# Patient Record
Sex: Male | Born: 1970 | Race: White | Hispanic: No | Marital: Married | Smoking: Never smoker
Health system: Southern US, Community
[De-identification: ages and names within clinical notes are randomized; demographics above are authoritative.]

---

## 2014-09-02 ENCOUNTER — Other Ambulatory Visit: Payer: Self-pay | Admitting: Adult Health

## 2014-09-02 ENCOUNTER — Ambulatory Visit (INDEPENDENT_AMBULATORY_CARE_PROVIDER_SITE_OTHER): Payer: Self-pay

## 2014-09-02 DIAGNOSIS — T1490XA Injury, unspecified, initial encounter: Secondary | ICD-10-CM

## 2014-09-02 DIAGNOSIS — M25572 Pain in left ankle and joints of left foot: Secondary | ICD-10-CM

## 2014-09-20 ENCOUNTER — Ambulatory Visit (INDEPENDENT_AMBULATORY_CARE_PROVIDER_SITE_OTHER): Payer: Self-pay

## 2014-09-20 ENCOUNTER — Other Ambulatory Visit: Payer: Self-pay | Admitting: Emergency Medicine

## 2014-09-20 DIAGNOSIS — S52122A Displaced fracture of head of left radius, initial encounter for closed fracture: Secondary | ICD-10-CM

## 2014-09-20 DIAGNOSIS — M25532 Pain in left wrist: Secondary | ICD-10-CM

## 2014-09-20 DIAGNOSIS — T1490XA Injury, unspecified, initial encounter: Secondary | ICD-10-CM

## 2016-04-02 IMAGING — CR DG ELBOW COMPLETE 3+V*L*
4 series · 4 of 4 positions shown · non-contrast
Comparison: No prior.

CLINICAL DATA: Left lateral elbow pain.  Fall at work this a.m..

EXAM:
LEFT ELBOW - COMPLETE 3+ VIEW

[view not recorded (1 of 4)]
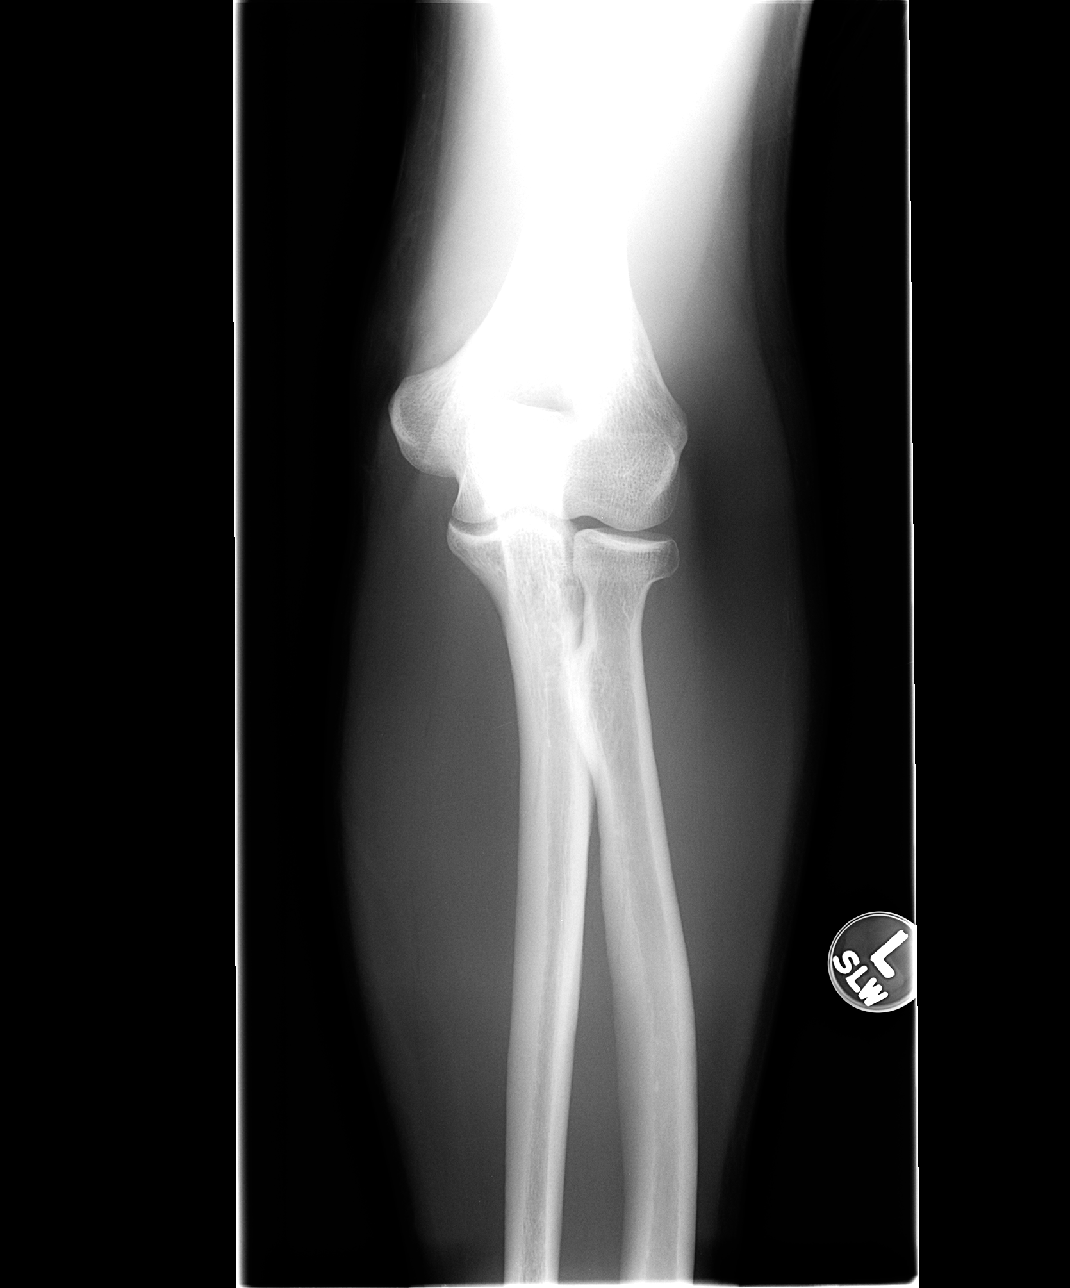

[view not recorded (2 of 4)]
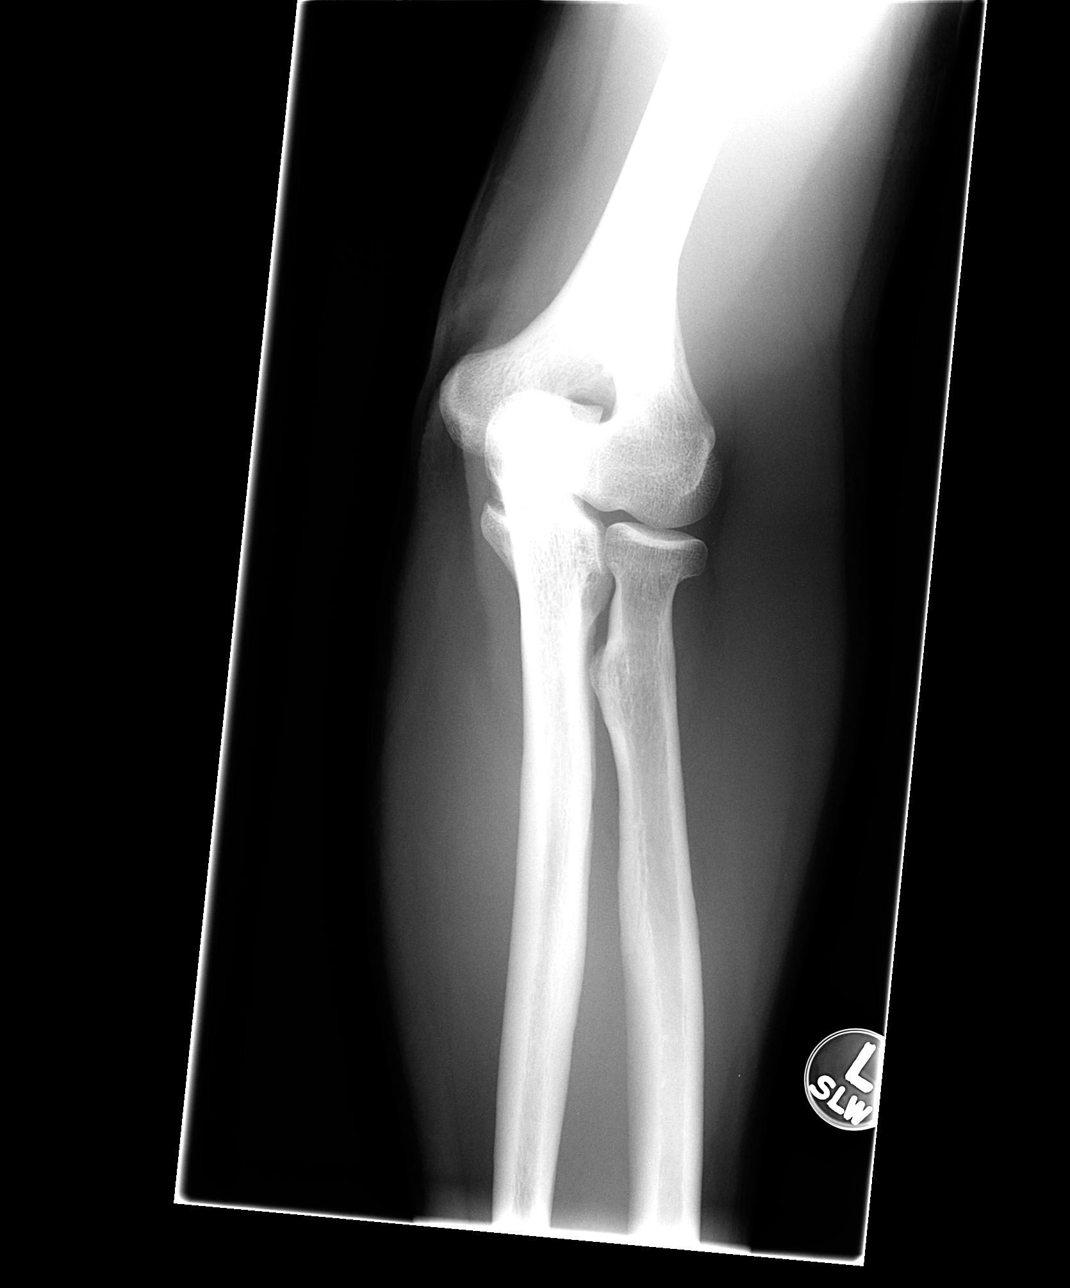

[view not recorded (3 of 4)]
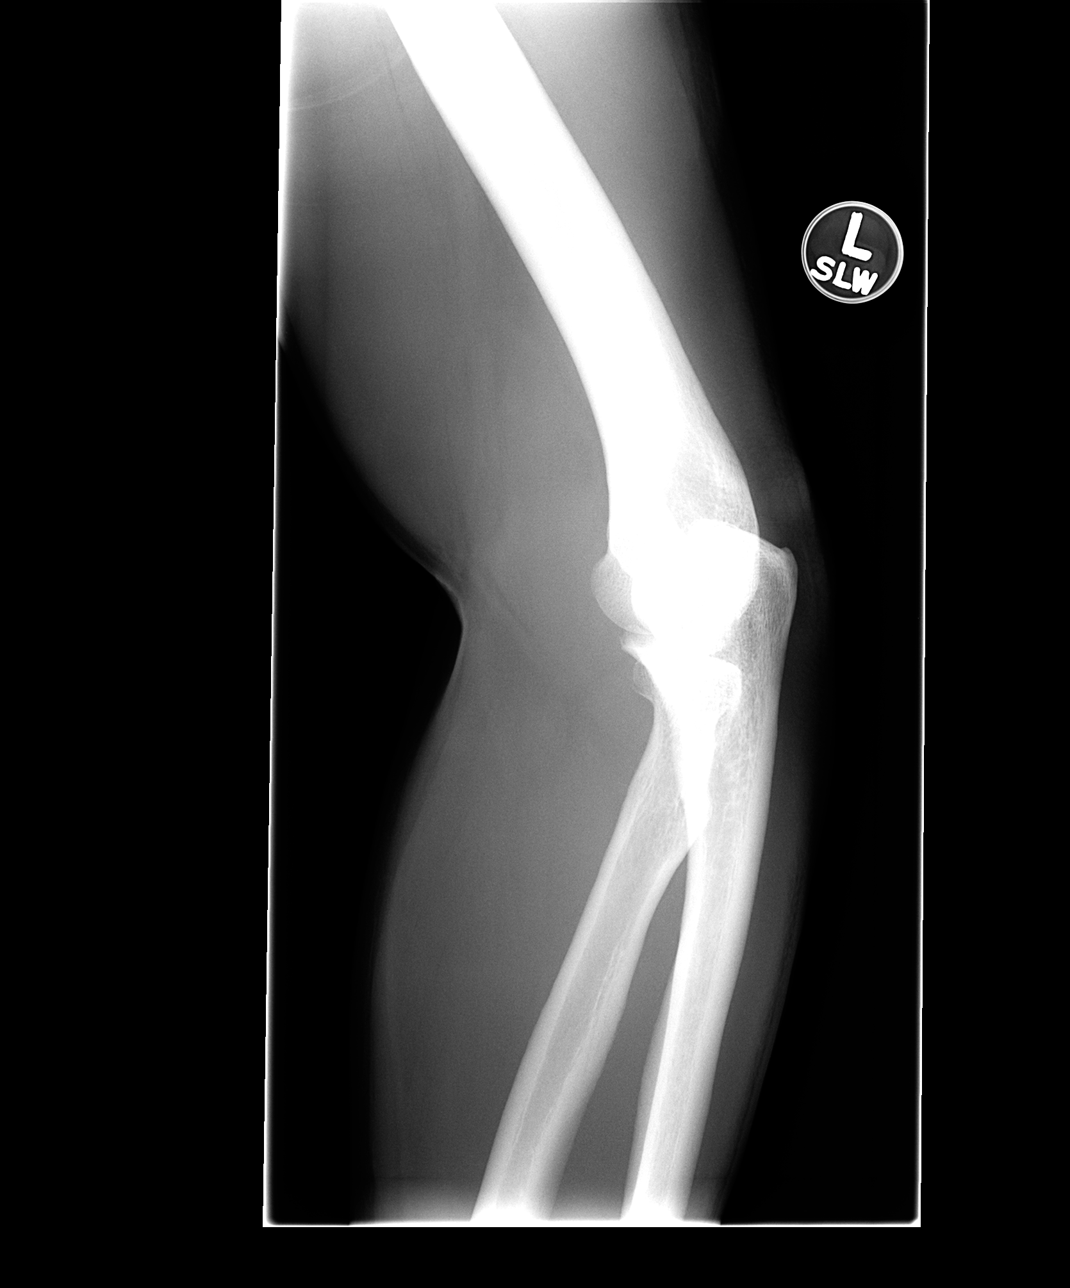

[view not recorded (4 of 4)]
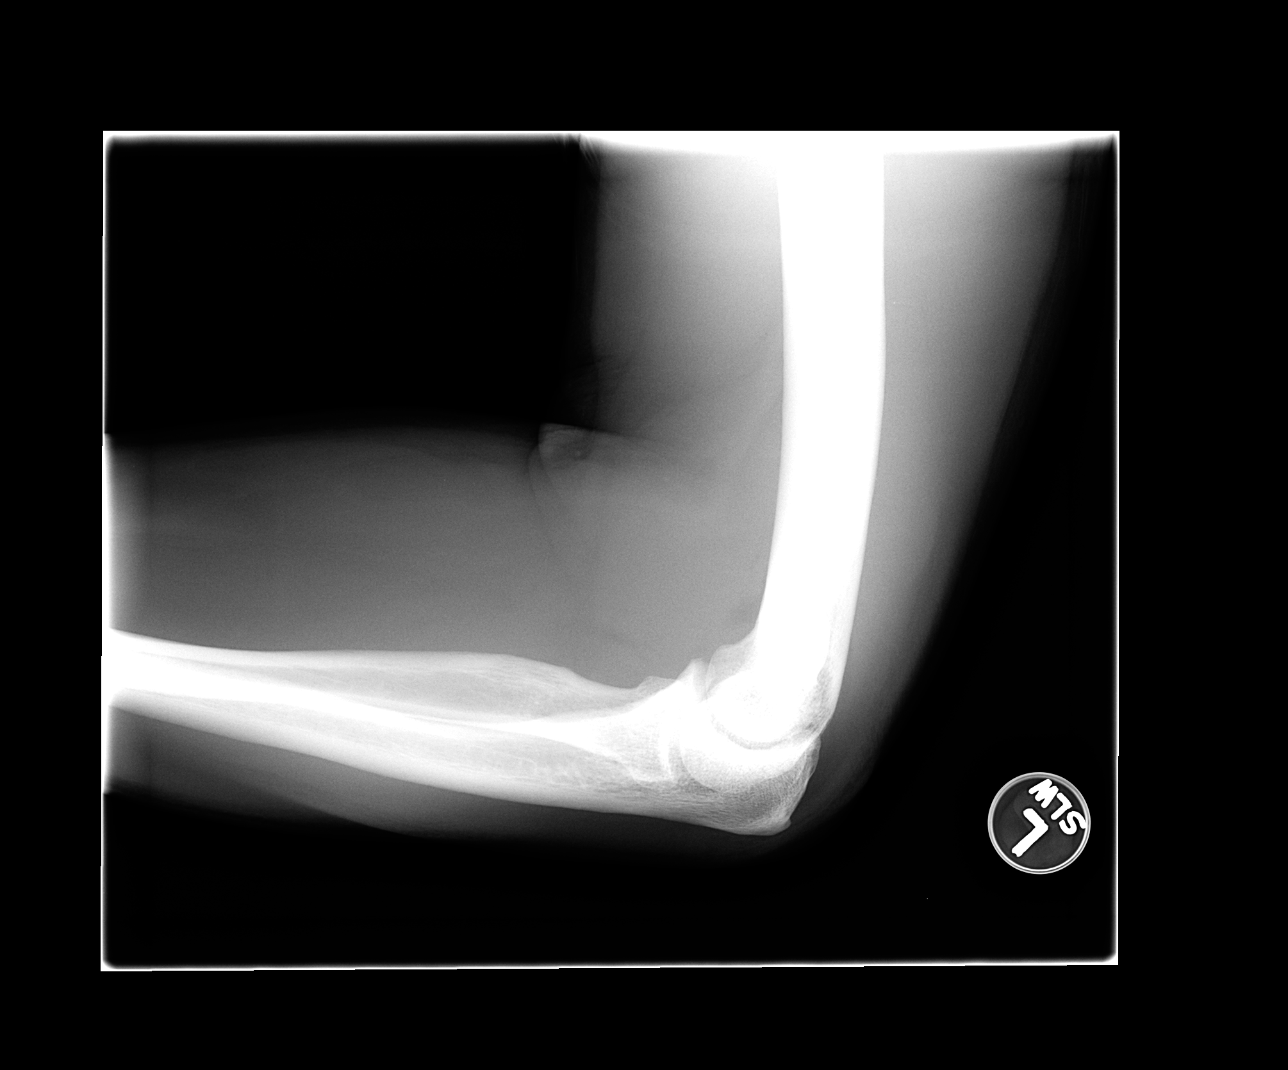

[4 of 4 positions shown; findings below may reference images not displayed]

FINDINGS: Left elbow joint effusion is noted. Several fractured radial head
appears present. Fracture is nondisplaced. No evidence of
dislocation.
IMPRESSION: Subtle left radial head fracture with left elbow joint effusion
small effusion.

## 2021-11-08 ENCOUNTER — Other Ambulatory Visit: Payer: Self-pay

## 2021-11-08 ENCOUNTER — Emergency Department (INDEPENDENT_AMBULATORY_CARE_PROVIDER_SITE_OTHER)
Admission: EM | Admit: 2021-11-08 | Discharge: 2021-11-08 | Disposition: A | Discharge status: Home / Self Care | Payer: Commercial Managed Care - PPO | Source: Home / Self Care

## 2021-11-08 DIAGNOSIS — W57XXXA Bitten or stung by nonvenomous insect and other nonvenomous arthropods, initial encounter: Secondary | ICD-10-CM

## 2021-11-08 DIAGNOSIS — S80261A Insect bite (nonvenomous), right knee, initial encounter: Secondary | ICD-10-CM

## 2021-11-08 DIAGNOSIS — R21 Rash and other nonspecific skin eruption: Secondary | ICD-10-CM

## 2021-11-08 NOTE — ED Provider Notes (Signed)
?[facility] ? ? ? ?CSN: [id number] ?Arrival date & time: 11/08/21  1644 ? ? ?  ? ?History   ?Chief Complaint ?Chief Complaint  ?Patient presents with  ? Insect Bite  ?  Tick bite, RT leg  ? ? ?HPI ?Raul Torrance is a 51 y.o. male.  ? ?HPI ? ?Patient has a tick bite on the back of his right leg.  His wife pulled the tick off for him on Sunday.  It is now Wednesday.  He is here because the bite area is firm, red, and terribly itchy.  No fever or chills.  No headache or body ache.  No skin rash. ? ?History reviewed. No pertinent past medical history. ? ?There are no problems to display for this patient. ? ? ?History reviewed. No pertinent surgical history. ? ? ? ? ?Home Medications   ? ?Prior to Admission medications   ?Medication Sig Start Date End Date Taking? Authorizing Provider  ?levothyroxine (SYNTHROID) 112 MCG tablet Take 112 mcg by mouth daily. 09/27/21   [provider]  ? ? ?Family History ?History reviewed. No pertinent family history. ? ?Social History ?Social History  ? ?Tobacco Use  ? Smoking status: Never  ? Smokeless tobacco: Never  ?Vaping Use  ? Vaping Use: Never used  ?Substance Use Topics  ? Alcohol use: Yes  ?  Comment: occ  ? ? ? ?Allergies   ?Patient has no allergy information on record. ? ? ?Review of Systems ?Review of Systems ?See HPI ? ?Physical Exam ?Triage Vital Signs ?ED Triage Vitals  ?Enc Vitals Group  ?   BP 11/08/21 1705 (!) 162/78  ?   Pulse Rate 11/08/21 1705 64  ?   Resp 11/08/21 1705 18  ?   Temp 11/08/21 1705 98.6 ?F (37 ?C)  ?   Temp Source 11/08/21 1705 Oral  ?   SpO2 11/08/21 1705 99 %  ?   Weight --   ?   Height --   ?   Head Circumference --   ?   Peak Flow --   ?   Pain Score 11/08/21 1706 0  ?   Pain Loc --   ?   Pain Edu? --   ?   Excl. in GC? --   ? ?No data found. ? ?Updated Vital Signs ?BP (!) 162/78 (BP Location: Right Arm)   Pulse 64   Temp 98.6 ?F (37 ?C) (Oral)   Resp 18   SpO2 99%  ?   ? ?Physical Exam ?Constitutional:   ?   General: He is  not in acute distress. ?   Appearance: Normal appearance. He is well-developed and normal weight.  ?HENT:  ?   Head: Normocephalic and atraumatic.  ?   Mouth/Throat:  ?   Comments: Mask is in place ?Eyes:  ?   Conjunctiva/sclera: Conjunctivae normal.  ?   Pupils: Pupils are equal, round, and reactive to light.  ?Cardiovascular:  ?   Rate and Rhythm: Normal rate.  ?Pulmonary:  ?   Effort: Pulmonary effort is normal. No respiratory distress.  ?Abdominal:  ?   General: There is no distension.  ?   Palpations: Abdomen is soft.  ?Musculoskeletal:     ?   General: Normal range of motion.  ?   Cervical back: Normal range of motion.  ?Skin: ?   General: Skin is warm and dry.  ?   Findings: Lesion present.  ?   Comments: On the back of  the right leg there is an excoriated lesion that measures 4 mm.  There is another 2 mm surrounding erythema.  No target lesion.  ?Neurological:  ?   General: No focal deficit present.  ?   Mental Status: He is alert.  ?   Gait: Gait normal.  ? ? ? ?UC Treatments / Results  ?Labs ?(all labs ordered are listed, but only abnormal results are displayed) ?Labs Reviewed - No data to display ? ?EKG ? ? ?Radiology ?No results found. ? ?Procedures ?Procedures (including critical care time) ? ?Medications Ordered in UC ?Medications - No data to display ? ?Initial Impression / Assessment and Plan / UC Course  ?I have reviewed the triage vital signs and the nursing notes. ? ?Pertinent labs & imaging results that were available during my care of the patient were reviewed by me and considered in my medical decision making (see chart for details). ? ?  ? ?Patient showed me a picture of the tick on his phone.  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  These are not associated with Lyme disease.  He is to watch for any systemic symptoms and let us know or call his personal physician. ?Final Clinical Impressions(s) / UC Diagnoses  ? ?Final diagnoses:  ?Rash  ?Tick bite of right knee, initial encounter  ? ? ? ?Discharge  Instructions   ? ?  ?May take antihistamines for the itching.  Claritin or Zyrtec during the day, Benadryl at night ?May use a cortisone cream to reduce the itching and swelling ?This will slowly resolve over a period of weeks ?Return if you have enlarging rash, increasing pain, fever or chills or new symptoms ? ? ?ED Prescriptions   ?None ?  ? ?PDMP not reviewed this encounter. ?  ?Eustace Moore, MD ?11/08/21 1744 ? ?

## 2021-11-08 NOTE — Discharge Instructions (Signed)
May take antihistamines for the itching.  Claritin or Zyrtec during the day, Benadryl at night ?May use a cortisone cream to reduce the itching and swelling ?This will slowly resolve over a period of weeks ?Return if you have enlarging rash, increasing pain, fever or chills or new symptoms ?

## 2021-11-08 NOTE — ED Triage Notes (Addendum)
Pt c/o tick bite since Sunday evening. Wife removed. Pt states area is red and spreading. Itching every day since.  ?

## 2023-05-25 ENCOUNTER — Other Ambulatory Visit: Payer: Self-pay

## 2023-05-25 ENCOUNTER — Ambulatory Visit
Admission: EM | Admit: 2023-05-25 | Discharge: 2023-05-25 | Disposition: A | Discharge status: Home / Self Care | Payer: Commercial Managed Care - PPO | Attending: Family Medicine | Admitting: Family Medicine

## 2023-05-25 DIAGNOSIS — L298 Other pruritus: Secondary | ICD-10-CM

## 2023-05-25 DIAGNOSIS — R21 Rash and other nonspecific skin eruption: Secondary | ICD-10-CM

## 2023-05-25 DIAGNOSIS — L2989 Other pruritus: Secondary | ICD-10-CM

## 2023-05-25 MED ORDER — METHYLPREDNISOLONE SODIUM SUCC 125 MG IJ SOLR
125.0000 mg | Freq: Once | INTRAMUSCULAR | Status: AC
  Administered 2023-05-25: 125 mg via INTRAMUSCULAR

## 2023-05-25 MED ORDER — PREDNISONE 10 MG (21) PO TBPK: ORAL_TABLET | Freq: Every day | ORAL | 0 refills | Status: AC

## 2023-05-25 NOTE — ED Triage Notes (Signed)
Patient state he came in contact with orange oil today, and noticed generalized rash, and itchiness. Patient denies [facility] and speech is clear.   Home interventions: benadryl around 1200

## 2023-05-25 NOTE — Discharge Instructions (Addendum)
Advised patient to take medications as directed with food to completion.  Encouraged increase daily water intake to 64 ounces per day while taking this medication.  Advised if symptoms worsen and/or unresolved please follow-up with PCP or here for further evaluation.

## 2023-05-25 NOTE — ED Provider Notes (Signed)
[facility]    CSN: [id number] Arrival date & time: 05/25/23  1259      History   Chief Complaint Chief Complaint  Patient presents with   Rash    HPI [facility] is a 52 y.o. male.   HPI Pleasant 52 year old male presents with body rash reports came in contact with RN [facility] today and noticed generalized rash and itchiness.  PMH significant for thyroid disease.  History reviewed. No pertinent past medical history.  There are no problems to display for this patient.   History reviewed. No pertinent surgical history.     Home Medications    Prior to Admission medications   Medication Sig Start Date End Date Taking? Authorizing Provider  predniSONE (STERAPRED UNI-PAK 21 TAB) 10 MG (21) TBPK tablet Take by mouth daily. Take 6 tabs by mouth daily  for 2 days, then 5 tabs for 2 days, then 4 tabs for 2 days, then 3 tabs for 2 days, 2 tabs for 2 days, then 1 tab by mouth daily for 2 days 05/25/23  Yes Trevor Iha, FNP  levothyroxine (SYNTHROID) 112 MCG tablet Take 112 mcg by mouth daily. 09/27/21   [provider]    Family History History reviewed. No pertinent family history.  Social History Social History   Tobacco Use   Smoking status: Never   Smokeless tobacco: Never  Vaping Use   Vaping status: Never Used  Substance Use Topics   Alcohol use: Yes    Comment: occ     Allergies   Patient has no allergy information on record.   Review of Systems Review of Systems  Skin:  Positive for rash.  All other systems reviewed and are negative.    Physical Exam Triage Vital Signs ED Triage Vitals  Encounter Vitals Group     BP      Systolic BP Percentile      Diastolic BP Percentile      Pulse      Resp      Temp      Temp src      SpO2      Weight      Height      Head Circumference      Peak Flow      Pain Score      Pain Loc      Pain Education      Exclude from Growth Chart    No data found.  Updated Vital Signs BP  108/72 (BP Location: Left Arm)   Pulse 65   Temp 97.7 F (36.5 C) (Oral)   Resp 18   SpO2 99%    Physical Exam Vitals and nursing note reviewed.  Constitutional:      Appearance: Normal appearance. He is normal weight.  HENT:     Head: Normocephalic and atraumatic.     Mouth/Throat:     Mouth: Mucous membranes are moist.     Pharynx: Oropharynx is clear.  Eyes:     Extraocular Movements: Extraocular movements intact.     Conjunctiva/sclera: Conjunctivae normal.     Pupils: Pupils are equal, round, and reactive to light.  Cardiovascular:     Rate and Rhythm: Normal rate and regular rhythm.     Pulses: Normal pulses.     Heart sounds: Normal heart sounds.  Pulmonary:     Effort: Pulmonary effort is normal.     Breath sounds: Normal breath sounds. No wheezing, rhonchi or rales.  Musculoskeletal:  General: Normal range of motion.     Cervical back: Normal range of motion and neck supple.  Skin:    General: Skin is warm and dry.     Comments: Bilateral lower arms (volar aspect)/posterior legs (superior aspect): Pruritic erythematous maculopapular eruption-please see images below  Neurological:     General: No focal deficit present.     Mental Status: He is alert and oriented to person, place, and time. Mental status is at baseline.  Psychiatric:        Mood and Affect: Mood normal.        Behavior: Behavior normal.           UC Treatments / Results  Labs (all labs ordered are listed, but only abnormal results are displayed) Labs Reviewed - No data to display  EKG   Radiology No results found.  Procedures Procedures (including critical care time)  Medications Ordered in UC Medications  methylPREDNISolone sodium succinate (SOLU-MEDROL) 125 mg/2 mL injection 125 mg (125 mg Intramuscular Given 05/25/23 1359)    Initial Impression / Assessment and Plan / UC Course  I have reviewed the triage vital signs and the nursing notes.  Pertinent labs & imaging  results that were available during my care of the patient were reviewed by me and considered in my medical decision making (see chart for details).      MDM: 1.  Pruritic erythematous rash-IM Solu-Medrol 125 mg given once and clinic and prior to discharge; 2.  Rash and nonspecific skin eruption-Rx'd Sterapred Unipak (tapering from 60 mg to 10 mg over 10 days). Advised patient to take medications as directed with food to completion.  Encouraged increase daily water intake to 64 ounces per day while taking this medication.  Advised if symptoms worsen and/or unresolved please follow-up with PCP or here for further evaluation.  Patient discharged home, hemodynamically stable.  Final Clinical Impressions(s) / UC Diagnoses   Final diagnoses:  Rash and nonspecific skin eruption  Pruritic erythematous rash     Discharge Instructions      Advised patient to take medications as directed with food to completion.  Encouraged increase daily water intake to 64 ounces per day while taking this medication.  Advised if symptoms worsen and/or unresolved please follow-up with PCP or here for further evaluation.     ED Prescriptions     Medication Sig Dispense Auth. Provider   predniSONE (STERAPRED UNI-PAK 21 TAB) 10 MG (21) TBPK tablet Take by mouth daily. Take 6 tabs by mouth daily  for 2 days, then 5 tabs for 2 days, then 4 tabs for 2 days, then 3 tabs for 2 days, 2 tabs for 2 days, then 1 tab by mouth daily for 2 days 42 tablet Trevor Iha, FNP      PDMP not reviewed this encounter.   Trevor Iha, FNP 05/25/23 1425
